# Patient Record
Sex: Male | Born: 1990 | Race: White | Hispanic: No | Marital: Single | Smoking: Current some day smoker
Health system: Southern US, Community
[De-identification: ages and names within clinical notes are randomized; demographics above are authoritative.]

## PROBLEM LIST (undated history)

## (undated) DIAGNOSIS — C801 Malignant (primary) neoplasm, unspecified: Secondary | ICD-10-CM

## (undated) DIAGNOSIS — T7840XA Allergy, unspecified, initial encounter: Secondary | ICD-10-CM

## (undated) DIAGNOSIS — D169 Benign neoplasm of bone and articular cartilage, unspecified: Secondary | ICD-10-CM

## (undated) HISTORY — DX: Benign neoplasm of bone and articular cartilage, unspecified: D16.9

## (undated) HISTORY — PX: TUMOR EXCISION: SHX421

## (undated) HISTORY — DX: Allergy, unspecified, initial encounter: T78.40XA

## (undated) HISTORY — DX: Malignant (primary) neoplasm, unspecified: C80.1

## (undated) HISTORY — PX: OSTEOCHONDROMA EXCISION: SHX2137

---
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
## 2011-05-07 NOTE — Op Note (Signed)
NAME:  Timothy Davila, Timothy Davila NO.:  [id number]   MEDICAL RECORD NO.:  [id number]                   PATIENT TYPE:  OIB   LOCATION:  NA                                   FACILITY:  [facility]   PHYSICIAN:  Dionne Ano. Everlene Other, M.D.         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   PROCEDURE:  Excision, osteochondroma about the right posterior knee region.   SURGEON:  Dionne Ano. Amanda Pea, M.D.   ASSISTANT:  Karie Chimera, P.A.-C.   COMPLICATIONS:  None.   SPECIMENS:  One.   DRAINS:  One.   ESTIMATED BLOOD LOSS:  Less than 35 mL.   INDICATION FOR PROCEDURE:  This patient is a very pleasant 20 year old male.  I have counseled him and his family at length in regard to risks and  benefits of surgery.  The patient has had a prior osteochondroma removed  from his right thumb by myself, and he has done wonderfully from this  without recurrence of his impingement process.  He has had prior  osteochondromas removed from the right leg in the past with recurrence.  I  have discussed with the family that typically these will recur if taken off  before skeletal maturity.  He and the family understand this; however, he is  having significant impingement symptoms about the posterior right lower  extremity with flexion.  Thus, we have made plans to proceed with excision  to allow for easier activities of daily living and improvement with sports  participation.  I have discussed with him risks and benefits at length,  including possible malignant transformation, etc.  With this in  mind, he  desires to proceed.  All questions have been encouraged and answered  preoperatively.   I have discussed with them specifically risks of bleeding, infection, damage  to normal structures, and failure of surgery to accomplish its intended  goals of relieving symptoms and restoring function.   OPERATIVE PROCEDURE:  This patient was taken to the operative suite,  preoperative Ancef was given.  He was laid supine and underwent a general  endotracheal anesthetic under the direction of Sheldon Silvan, M.D.  He was  then turned into the prone position, well-padded.  Rolls were placed under  the chest for support and following this, I examined the right lower  extremity.  Once this was done we placed a tourniquet and performed a  thorough prep and drape with Hibiclens.  He has a SHELLFISH allergy, I  should note.  Once this was done, the sterile field was isolated and a  longitudinal incision  was made under 300 mm of tourniquet control.  The  longitudinal incision was carried through the skin but not __________ along  the previous incision.  Once this was done, the patient had dissection  carried down until the biceps muscle belly was identified.  The long and  short heads were identified and the mass was fairly prominent within the  biceps, and thus at this point in time a muscle-splitting approach was used.  I very carefully retracted the elements toward the midline, which were  protected by the muscle fibers.  The neurovascular structures were kept out  of harm's way with retractors.  The patient's pulse was checked  preoperatively and kept at 90 at all times.  Once the mass was  circumferentially dissected deeply, I then took an osteotome curved in  nature and removed the prominent osteochondroma.  I should note the patient  does have a large sessile osteochondroma near the growth plate, which we did  not plan to take off.  The impinging process, of course, was the one that  was  removed.  This was removed to my satisfaction without difficulty and  sent for specimen.  Following its removal I then curetted the stalk area  down to the normal-appearing bone and then irrigated and placed bone wax  about the area.  The tourniquet was then deflated and hemostasis was  obtained.  Hemostasis was good.  I irrigated the wound copiously and  following this I placed a JP drain.  This was done without difficulty to  allow for egress of any fluid.  Once this was done, the muscle belly was  allowed to lay overlying the area of excision.  The area of excision was  smoothed with rongeur and file prior to closure.  Once the wound was  irrigated, hemostasis was satisfactory and I was pleased.  I then placed  Marcaine 0.5% with epinephrine in the wound for postop analgesia and then  closed the wound with 2-0 Vicryl and 3-0 Prolene.  The drain was hooked up  to suction, a sterile dressing applied, and the patient was then turned  supine, a knee immobilizer placed, and he was then extubated and taken to  the recovery room.  All sponge, needle, and instrument counts were reported  as correct.  There were no immediate intraoperative complications.   The patient will be monitored in the recovery room.  He will spend the night  for IV antibiotics and pain medicine, and we will proceed accordingly.  At  the conclusion of the procedure he had good stability about the femur, of  course, and there were no immediate complications.  Pulses were excellent  and compartments were soft, with a normal popliteal artery pulse.  It has  been a pleasure to participate in his care, and we look forward to  participating in his postoperative recovery.                                                Dionne Ano. Everlene Other, M.D.    Nash Mantis  D:  06/25/2003  T:  06/25/2003  Job:  [id number]

## 2011-09-13 LAB — COMPREHENSIVE METABOLIC PANEL
Albumin: 4
BUN: 9
Creatinine, Ser: 0.88
Glucose, Bld: 102 — ABNORMAL HIGH
Total Bilirubin: 0.9
Total Protein: 6.7

## 2011-09-13 LAB — URINALYSIS, ROUTINE W REFLEX MICROSCOPIC
Bilirubin Urine: NEGATIVE
Glucose, UA: NEGATIVE
Hgb urine dipstick: NEGATIVE
Ketones, ur: NEGATIVE
Protein, ur: NEGATIVE
pH: 6.5

## 2011-09-13 LAB — DIFFERENTIAL
Basophils Absolute: 0
Basophils Relative: 1
Lymphocytes Relative: 36
Monocytes Absolute: 0.5
Neutro Abs: 3.9
Neutrophils Relative %: 53

## 2011-09-13 LAB — CBC
HCT: 42.5
Hemoglobin: 14.7
MCV: 79.1
Platelets: 160
RDW: 13.5

## 2014-02-01 ENCOUNTER — Emergency Department (HOSPITAL_COMMUNITY)
Admission: EM | Admit: 2014-02-01 | Discharge: 2014-02-01 | Disposition: A | Discharge status: Home / Self Care | Payer: Commercial Managed Care - PPO | Attending: Emergency Medicine | Admitting: Emergency Medicine

## 2014-02-01 ENCOUNTER — Encounter (HOSPITAL_COMMUNITY): Payer: Self-pay | Admitting: Emergency Medicine

## 2014-02-01 ENCOUNTER — Emergency Department (HOSPITAL_COMMUNITY): Payer: Commercial Managed Care - PPO

## 2014-02-01 DIAGNOSIS — R112 Nausea with vomiting, unspecified: Secondary | ICD-10-CM | POA: Insufficient documentation

## 2014-02-01 DIAGNOSIS — J029 Acute pharyngitis, unspecified: Secondary | ICD-10-CM | POA: Insufficient documentation

## 2014-02-01 DIAGNOSIS — J111 Influenza due to unidentified influenza virus with other respiratory manifestations: Secondary | ICD-10-CM | POA: Insufficient documentation

## 2014-02-01 DIAGNOSIS — F17200 Nicotine dependence, unspecified, uncomplicated: Secondary | ICD-10-CM | POA: Insufficient documentation

## 2014-02-01 DIAGNOSIS — R69 Illness, unspecified: Secondary | ICD-10-CM

## 2014-02-01 MED ORDER — BENZONATATE 100 MG PO CAPS: 100.0000 mg | ORAL_CAPSULE | Freq: Three times a day (TID) | ORAL | Status: DC

## 2014-02-01 NOTE — ED Notes (Signed)
Pt alert and oriented x4. Respirations even and unlabored, bilateral symmetrical rise and fall of chest. Skin warm and dry. In no acute distress. Denies needs.   

## 2014-02-01 NOTE — Discharge Instructions (Signed)
1. Medications: tessalon, usual home medications 2. Treatment: rest, drink plenty of fluids, tylenol/ibuprofen for fever and body aches,  3. Follow Up: Please followup with your primary doctor for discussion of your diagnoses and further evaluation after today's visit; if you do not have a primary care doctor use the resource guide provided to find one;    Influenza, Adult Influenza ("the flu") is a viral infection of the respiratory tract. It occurs more often in winter months because people spend more time in close contact with one another. Influenza can make you feel very sick. Influenza easily spreads from person to person (contagious). CAUSES  Influenza is caused by a virus that infects the respiratory tract. You can catch the virus by breathing in droplets from an infected person's cough or sneeze. You can also catch the virus by touching something that was recently contaminated with the virus and then touching your mouth, nose, or eyes. SYMPTOMS  Symptoms typically last 4 to 10 days and may include:  Fever.  Chills.  Headache, body aches, and muscle aches.  Sore throat.  Chest discomfort and cough.  Poor appetite.  Weakness or feeling tired.  Dizziness.  Nausea or vomiting. DIAGNOSIS  Diagnosis of influenza is often made based on your history and a physical exam. A nose or throat swab test can be done to confirm the diagnosis. RISKS AND COMPLICATIONS You may be at risk for a more severe case of influenza if you smoke cigarettes, have diabetes, have chronic heart disease (such as heart failure) or lung disease (such as asthma), or if you have a weakened immune system. Elderly people and pregnant women are also at risk for more serious infections. The most common complication of influenza is a lung infection (pneumonia). Sometimes, this complication can require emergency medical care and may be life-threatening. PREVENTION  An annual influenza vaccination (flu shot) is the best  way to avoid getting influenza. An annual flu shot is now routinely recommended for all adults in the [place] TREATMENT  In mild cases, influenza goes away on its own. Treatment is directed at relieving symptoms. For more severe cases, your caregiver may prescribe antiviral medicines to shorten the sickness. Antibiotic medicines are not effective, because the infection is caused by a virus, not by bacteria. HOME CARE INSTRUCTIONS  Only take over-the-counter or prescription medicines for pain, discomfort, or fever as directed by your caregiver.  Use a cool mist humidifier to make breathing easier.  Get plenty of rest until your temperature returns to normal. This usually takes 3 to 4 days.  Drink enough fluids to keep your urine clear or pale yellow.  Cover your mouth and nose when coughing or sneezing, and wash your hands well to avoid spreading the virus.  Stay home from work or school until your fever has been gone for at least 1 full day. SEEK MEDICAL CARE IF:   You have chest pain or a deep cough that worsens or produces more mucus.  You have nausea, vomiting, or diarrhea. SEEK IMMEDIATE MEDICAL CARE IF:   You have difficulty breathing, shortness of breath, or your skin or nails turn bluish.  You have severe neck pain or stiffness.  You have a severe headache, facial pain, or earache.  You have a worsening or recurring fever.  You have nausea or vomiting that cannot be controlled. MAKE SURE YOU:  Understand these instructions.  Will watch your condition.  Will get help right away if you are not doing well or get worse.  Document Released: 12/03/2000 Document Revised: 06/06/2012 Document Reviewed: 03/06/2012 [facility] Patient Information 2014 Timothy Davila, [place].    Emergency Department Resource Guide 1) Find a Doctor and Pay Out of Pocket Although you won't have to find out who is covered by your insurance plan, it is a good idea to ask around and get recommendations. You  will then need to call the office and see if the doctor you have chosen will accept you as a new patient and what types of options they offer for patients who are self-pay. Some doctors offer discounts or will set up payment plans for their patients who do not have insurance, but you will need to ask so you aren't surprised when you get to your appointment.  2) Contact Your Local Health Department Not all health departments have doctors that can see patients for sick visits, but many do, so it is worth a call to see if yours does. If you don't know where your local health department is, you can check in your phone book. The CDC also has a tool to help you locate your state's health department, and many state websites also have listings of all of their local health departments.  3) Find a [facility] If your illness is not likely to be very severe or complicated, you may want to try a walk in clinic. These are popping up all over the country in pharmacies, drugstores, and shopping centers. They're usually staffed by nurse practitioners or physician assistants that have been trained to treat common illnesses and complaints. They're usually fairly quick and inexpensive. However, if you have serious medical issues or chronic medical problems, these are probably not your best option.  No Primary Care Doctor: - Call Health Connect at  [phone] - they can help you locate a primary care doctor that  accepts your insurance, provides certain services, etc. - Physician Referral Service- [phone]  Chronic Pain Problems: Organization         Address  Phone   Notes  [facility]  [phone] Patients need to be referred by their primary care doctor.   Medication Assistance: Organization         Address  Phone   Notes  [facility] Medication [facility] [address] [place], [place] [address] [phone] --Must be a resident of [facility] -- Must  have NO insurance coverage whatsoever (no Medicaid/ Medicare, etc.) -- The pt. MUST have a primary care doctor that directs their care regularly and follows them in the community   [facility]ist  [phone]   [employer]  [phone]    Agencies that provide inexpensive medical care: Organization         Address  Phone   Notes  [place]  [phone]   [facility]    [phone]   [facility] [place], [place] [address] [phone]   [place] [address], [place] [phone]   Planned Parenthood    [phone]   [facility]    [phone]   [place] and [place] [address], [place] Phone:  [phone], Fax:  [phone] Hours of Operation:  9 am - 6 pm, M-F.  Also accepts Medicaid/Medicare and self-pay.  [facility] for [place], [address], [place] Phone: [phone], Fax: [phone]. Hours of  Operation:  8:30 am - 5:30 pm, M-F.  Also accepts Medicaid and self-pay.  [facility] [place] [address], [place] Phone: [phone]   [place], [place], [place] [phone], Ext. [phone] Mondays & Thursdays: 7-9 AM.  First 15 patients are seen on a first come, first serve basis.    [place] Providers:  Organization         Address  Phone   Notes  [facility] [address], [place] [phone] Also accepts self-pay patients.  [facility] [address], [place]  [phone]   [place], [address], [place] [phone]   [facility] [address] [place] [phone]   Lucianne Lei [address], [place]   [phone] Only accepts [place] Access [place] patients after  they have their name applied to their card.   Self-Pay (no insurance) in [facility]:  Organization         Address  Phone   Notes  Sickle Cell Patients, [facility] [place] [phone]   [facility] [place] [phone]   [facility] [place]  [place], [place], [place] [phone]   [facility]/Dr. Osei-Bonsu  [address], [place] or [address], [place] [phone] Phone number for both [place] and [place] locations is the same.  [facility] [address] [place] [phone]   [facility] [address], [place] or [address] [phone]   [facility] [address], [place] [phone], phone; [phone], fax Sees patients 1st and 3rd Saturday of every month.  Must not qualify for public or private insurance (i.e. Medicaid, Medicare, [place] Health Choice, Veterans' Benefits)  Household income should be no more than 200% of the poverty level The clinic cannot treat you if you are pregnant or think you are pregnant  Sexually transmitted diseases are not treated at the clinic.    Dental Care: Organization         Address  Phone  Notes  [facility] of [facility] [place] [phone] Accepts children up to age 67 who are enrolled in [place] or [place]; pregnant women with a Medicaid card; and children who have applied for Medicaid or [place] Health Choice, but were declined, whose parents can pay a reduced fee at time of service.  [facility] Department of [facility]  [address], [place] [phone] Accepts children up to age 46 who are enrolled in [place] or [place]; pregnant women with a Medicaid card; and children who  have applied for Medicaid or [place] Health Choice, but were declined, whose parents can pay a reduced fee at time of service.  [place] Adult Dental Access PROGRAM  [place] [phone] Patients are seen by appointment only. Walk-ins are not accepted. [place] will see patients 52 years of age and older. Monday - Tuesday (8am-5pm) Most Wednesdays (8:30-5pm) $30 per visit, cash only  [facility] Adult Dental Access PROGRAM  [address], [facility] [phone] Patients are seen  by appointment only. Walk-ins are not accepted. [facility] will see patients 62 years of age and older. One Wednesday Evening (Monthly: Volunteer Based).  $30 per visit, cash only  [employer]  [phone] for adults; Children under age 81, call Graduate Pediatric Dentistry at [phone]. Children aged 33-14, please call [phone] to request a pediatric application.  Dental services are provided in all areas of dental care including fillings, crowns and bridges, complete and partial dentures, implants, gum treatment, root canals, and extractions. Preventive care is also provided. Treatment is provided to both adults and children. Patients are selected via a lottery and there is often a waiting list.   [facility] [address], [place]  [phone] [email or web address]   [facility] [address] [place], [place] [phone], Ext. [phone] Second and Fourth Thursday of each month, opens at 6:30 AM; Clinic ends at 9 AM.  Patients are seen on a first-come first-served basis, and a limited number are seen during each clinic.   [facility]  [address] [place], [place] [phone]   Eligibility Requirements You must have lived in [place], [place], or [place] counties for at least the last three months.   You cannot be eligible for state or federal sponsored [place], including [place], [place], or [employer].   You generally cannot be eligible for healthcare insurance through your employer.    How to apply: Eligibility screenings are held every Tuesday and Wednesday afternoon from 1:00 pm until 4:00 pm. You do not need an appointment for the interview!  [facility] [address] [place], [place] [phone]   [facility] Health Department  [phone]   [facility] Health Department  [phone]   [facility] Health Department  [phone]    Behavioral Health Resources in the Community: Intensive Outpatient Programs Organization         Address  Phone  Notes  [facility] Services [address] [place], [place] [phone]   [facility] Outpatient [address], [place], [place] [phone]   [facility] [address] [place], [place]  [phone]   [facility] Mental Health [address]  [place], [place] [phone] or [phone]   Substance Abuse Resources Organization         Address  Phone  Notes  [facility] Drug Services  [phone]   [facility]  [phone]   [facility] [place]  [phone]   Floydene Flock  [phone]   Residential & Outpatient Substance Abuse Program  [phone]   Psychological Services Organization         Address  Phone  Notes  [facility] Behavioral Health  [phone]   [facility]  [phone]   [facility] Mental Health [address], [place] [phone] or [phone]    Mobile Crisis Teams Organization         Address  Phone  Notes  [facility], Mobile Crisis Care Unit  [phone]   [facility]  [address] [place], [place] [phone]   Doristine Locks [address] [place] [phone]    Self-Help/Support Groups Organization         Address  Phone             Notes  [facility]oc[facility] of [place] -  variety of support groups  336- I7437963 Call for more  information  Narcotics Anonymous (NA), [facility] [address], [employer] [place]  2 meetings at this location   Residential Sports administrator         Address  Phone  Notes  [facility] [address],    [place]  [phone]   [facility]  [address] [place] [address], [place], [place] [phone]   [facility] [address] [place], [place] [phone] Admissions: 8am-3pm M-F  [facility] [address]    [place], [place] [phone]   [facility] [address] [place], [place], [place] [phone]   [facility] [address],  [place], [place] [phone]   [facility] - Intensive Outpatient [address], [place], [place] [phone]   [facility] (Addiction Recovery [facility] [address] [place].,  [place], [place] [phone] or [phone]   [facility] ([facility]) [address], [place], [place] [phone] Accepts Medicaid  [facility] [place] [address]  [place] [phone] Substance Abuse/Addiction Treatment   [facility] Organization         Address  Phone  Notes  [facility]  [phone]   Angie Fava, PhD [address] [place], [place]   [phone] or [phone]   [facility] Behavioral   [address] [place], [place] [phone]   [facility] [address] [place], [place] [phone] Insurance/Medicaid/sponsorship through [employer] and Families [address]                                    [place], [place] [phone] Therapy/tele-psych/case  [facility] [address], [place] [phone]    Dr. Lolly Mustache  [phone]   [facility] [place]  [employer] [facility] Dept. 1) [address], [place] 2) [address], [place] 3)  [address], [place] [phone]  [phone]   [facility] Child Abuse Hotline (575)[phone] or [phone] (After Hours)

## 2014-02-01 NOTE — ED Provider Notes (Signed)
Medical screening examination/treatment/procedure(s) were performed by non-physician practitioner and as supervising physician I was immediately available for consultation/collaboration.  EKG Interpretation   None         Ephraim Hamburger, MD 02/01/14 2355

## 2014-02-01 NOTE — ED Notes (Signed)
Per pt, has been sick with fever, congestion, and cough x  5 days.  [facility] gave tamiflu and pt has taken since Monday with no improvement.

## 2014-02-01 NOTE — ED Provider Notes (Signed)
CSN: [id number]     Arrival date & time 02/01/14  1540 History   First MD Initiated Contact with Patient 02/01/14 1855     Chief Complaint  Patient presents with  . Fever  . Cough     (Consider location/radiation/quality/duration/timing/severity/associated sxs/prior Treatment) The history is provided by the patient and medical records. No language interpreter was used.    Timothy Davila is a 23 y.o. male  with a hx of Osteochondroma presents to the Emergency Department complaining of gradual, persistent, progressively worsening URI symptoms including low-grade fever, nasal congestion and cough onset 5 days ago.  [facility] and given tamiflu without relief of symptoms.  Associated symptoms include nasal congestion, rhinorrhea, postnasal drip, sore throat.  He also endorses one episode of emesis this morning without abdominal pain or diarrhea.  Patient reports that cold or flu medications helped the symptoms for only a little while and nothing seems to make her worse. Patient denies headache and neck pain, neck stiffness, abdominal pain, diarrhea, weakness, dizziness, syncope, dysuria. Patient reports he did not get a flu shot this year. He has unknown sick contacts.   History reviewed. No pertinent past medical history. Past Surgical History  Procedure Laterality Date  . Osteochondroma excision    . Tumor excision     History reviewed. No pertinent family history. History  Substance Use Topics  . Smoking status: Current Some Day Smoker  . Smokeless tobacco: Not on file  . Alcohol Use: Yes    Review of Systems  Constitutional: Positive for fever (low grade) and fatigue. Negative for chills and appetite change.  HENT: Positive for congestion, postnasal drip, rhinorrhea, sinus pressure and sore throat. Negative for ear discharge, ear pain and mouth sores.   Eyes: Negative for visual disturbance.  Respiratory: Positive for cough, chest  tightness, shortness of breath and wheezing. Negative for stridor.   Cardiovascular: Negative for chest pain, palpitations and leg swelling.  Gastrointestinal: Positive for nausea (in the mornings) and vomiting (x1 this AM). Negative for abdominal pain and diarrhea.  Genitourinary: Negative for dysuria, urgency, frequency and hematuria.  Musculoskeletal: Negative for arthralgias, back pain, myalgias and neck stiffness.  Skin: Negative for rash.  Neurological: Negative for syncope, light-headedness, numbness and headaches.  Hematological: Negative for adenopathy.  Psychiatric/Behavioral: The patient is not nervous/anxious.   All other systems reviewed and are negative.      Allergies  Shrimp  Home Medications   Current Outpatient Rx  Name  Route  Sig  Dispense  Refill  . benzonatate (TESSALON) 100 MG capsule   Oral   Take 1-2 capsules (100-200 mg total) by mouth every 8 (eight) hours.   21 capsule   0    BP 124/84  Pulse 78  Temp(Src) 98.7 F (37.1 C) (Oral)  Resp 16  SpO2 100% Physical Exam  Nursing note and vitals reviewed. Constitutional: He is oriented to person, place, and time. He appears well-developed and well-nourished. No distress.  Awake, alert, nontoxic appearance  HENT:  Head: Normocephalic and atraumatic.  Right Ear: Tympanic membrane, external ear and ear canal normal.  Left Ear: Tympanic membrane, external ear and ear canal normal.  Nose: Mucosal edema and rhinorrhea present. No epistaxis. Right sinus exhibits no maxillary sinus tenderness and no frontal sinus tenderness. Left sinus exhibits no maxillary sinus tenderness and no frontal sinus tenderness.  Mouth/Throat: Uvula is midline, oropharynx is clear and moist and mucous membranes are normal.  Mucous membranes are not pale and not cyanotic. No oropharyngeal exudate, posterior oropharyngeal edema, posterior oropharyngeal erythema or tonsillar abscesses.  Eyes: Conjunctivae are normal. Pupils are equal,  round, and reactive to light. No scleral icterus.  Neck: Normal range of motion and full passive range of motion without pain. Neck supple.  Cardiovascular: Normal rate, regular rhythm, normal heart sounds and intact distal pulses.   No murmur heard. Pulmonary/Chest: Effort normal and breath sounds normal. No stridor. No respiratory distress. He has no wheezes.  Congested cough but clear and equal breath sounds  Abdominal: Soft. Bowel sounds are normal. He exhibits no mass. There is no tenderness. There is no rebound and no guarding.  Musculoskeletal: Normal range of motion. He exhibits no edema.  Lymphadenopathy:    He has no cervical adenopathy.  Neurological: He is alert and oriented to person, place, and time.  Speech is clear and goal oriented Moves extremities without ataxia  Skin: Skin is warm and dry. No rash noted. He is not diaphoretic.  Psychiatric: He has a normal mood and affect.    ED Course  Procedures (including critical care time) Labs Review Labs Reviewed - No data to display Imaging Review Dg Chest 2 View  02/01/2014   CLINICAL DATA:  Shortness of breath, chest pain, cough.  EXAM: CHEST  2 VIEW  COMPARISON:  None.  FINDINGS: The heart size and mediastinal contours are within normal limits. Both lungs are clear. The visualized skeletal structures are unremarkable.  IMPRESSION: No active cardiopulmonary disease.   Electronically Signed   By: Rolm Baptise M.D.   On: 02/01/2014 16:52    EKG Interpretation   None       MDM   Final diagnoses:  Influenza-like illness   Timothy Davila presents with influenza like illness.  Patient with symptoms consistent with influenza.  Vitals are stable, low-grade fever.  No signs of dehydration, tolerating PO's.  Lungs are clear. CXR without evidence of pneumonia, pulmonary edema or pneumothorax.  Pt has already completed a course of Tamiflu.  Patient will be discharged with instructions to orally hydrate, rest, and use  over-the-counter medications such as anti-inflammatories ibuprofen and Aleve for muscle aches and Tylenol for fever.  Patient will also be given a cough suppressant.   It has been determined that no acute conditions requiring further emergency intervention are present at this time. The patient/guardian have been advised of the diagnosis and plan. We have discussed signs and symptoms that warrant return to the ED, such as changes or worsening in symptoms.   Vital signs are stable at discharge.   BP 124/84  Pulse 78  Temp(Src) 98.7 F (37.1 C) (Oral)  Resp 16  SpO2 100%  Patient/guardian has voiced understanding and agreed to follow-up with the PCP or specialist.       Abigail Butts, PA-C 02/01/14 1925

## 2014-03-19 ENCOUNTER — Ambulatory Visit: Payer: Commercial Managed Care - PPO

## 2014-03-19 ENCOUNTER — Ambulatory Visit (INDEPENDENT_AMBULATORY_CARE_PROVIDER_SITE_OTHER): Payer: Commercial Managed Care - PPO | Admitting: Emergency Medicine

## 2014-03-19 VITALS — BP 124/74 | HR 97 | Temp 98.2°F | Resp 17 | Ht 67.0 in | Wt 147.0 lb

## 2014-03-19 DIAGNOSIS — S139XXA Sprain of joints and ligaments of unspecified parts of neck, initial encounter: Secondary | ICD-10-CM

## 2014-03-19 DIAGNOSIS — S335XXA Sprain of ligaments of lumbar spine, initial encounter: Secondary | ICD-10-CM

## 2014-03-19 DIAGNOSIS — D169 Benign neoplasm of bone and articular cartilage, unspecified: Secondary | ICD-10-CM

## 2014-03-19 DIAGNOSIS — M25579 Pain in unspecified ankle and joints of unspecified foot: Secondary | ICD-10-CM

## 2014-03-19 MED ORDER — NAPROXEN SODIUM 550 MG PO TABS: 550.0000 mg | ORAL_TABLET | Freq: Two times a day (BID) | ORAL | Status: AC

## 2014-03-19 NOTE — Patient Instructions (Signed)
Cervical Sprain A cervical sprain is an injury in the neck in which the strong, fibrous tissues (ligaments) that connect your neck bones stretch or tear. Cervical sprains can range from mild to severe. Severe cervical sprains can cause the neck vertebrae to be unstable. This can lead to damage of the spinal cord and can result in serious nervous system problems. The amount of time it takes for a cervical sprain to get better depends on the cause and extent of the injury. Most cervical sprains heal in 1 to 3 weeks. CAUSES  Severe cervical sprains may be caused by:   Contact sport injuries (such as from football, rugby, wrestling, hockey, auto racing, gymnastics, diving, martial arts, or boxing).   Motor vehicle collisions.   Whiplash injuries. This is an injury from a sudden forward-and backward whipping movement of the head and neck.  Falls.  Mild cervical sprains may be caused by:   Being in an awkward position, such as while cradling a telephone between your ear and shoulder.   Sitting in a chair that does not offer proper support.   Working at a poorly designed computer station.   Looking up or down for long periods of time.  SYMPTOMS   Pain, soreness, stiffness, or a burning sensation in the front, back, or sides of the neck. This discomfort may develop immediately after the injury or slowly, 24 hours or more after the injury.   Pain or tenderness directly in the middle of the back of the neck.   Shoulder or upper back pain.   Limited ability to move the neck.   Headache.   Dizziness.   Weakness, numbness, or tingling in the hands or arms.   Muscle spasms.   Difficulty swallowing or chewing.   Tenderness and swelling of the neck.  DIAGNOSIS  Most of the time your health care provider can diagnose a cervical sprain by taking your history and doing a physical exam. Your health care provider will ask about previous neck injuries and any known neck  problems, such as arthritis in the neck. X-rays may be taken to find out if there are any other problems, such as with the bones of the neck. Other tests, such as a CT scan or MRI, may also be needed.  TREATMENT  Treatment depends on the severity of the cervical sprain. Mild sprains can be treated with rest, keeping the neck in place (immobilization), and pain medicines. Severe cervical sprains are immediately immobilized. Further treatment is done to help with pain, muscle spasms, and other symptoms and may include:  Medicines, such as pain relievers, numbing medicines, or muscle relaxants.   Physical therapy. This may involve stretching exercises, strengthening exercises, and posture training. Exercises and improved posture can help stabilize the neck, strengthen muscles, and help stop symptoms from returning.  HOME CARE INSTRUCTIONS   Put ice on the injured area.   Put ice in a plastic bag.   Place a towel between your skin and the bag.   Leave the ice on for 15 20 minutes, 3 4 times a day.   If your injury was severe, you may have been given a cervical collar to wear. A cervical collar is a two-piece collar designed to keep your neck from moving while it heals.  Do not remove the collar unless instructed by your health care provider.  If you have long hair, keep it outside of the collar.  Ask your health care provider before making any adjustments to your collar.   Minor adjustments may be required over time to improve comfort and reduce pressure on your chin or on the back of your head.  Ifyou are allowed to remove the collar for cleaning or bathing, follow your health care provider's instructions on how to do so safely.  Keep your collar clean by wiping it with mild soap and water and drying it completely. If the collar you have been given includes removable pads, remove them every 1 2 days and hand wash them with soap and water. Allow them to air dry. They should be completely  dry before you wear them in the collar.  If you are allowed to remove the collar for cleaning and bathing, wash and dry the skin of your neck. Check your skin for irritation or sores. If you see any, tell your health care provider.  Do not drive while wearing the collar.   Only take over-the-counter or prescription medicines for pain, discomfort, or fever as directed by your health care provider.   Keep all follow-up appointments as directed by your health care provider.   Keep all physical therapy appointments as directed by your health care provider.   Make any needed adjustments to your workstation to promote good posture.   Avoid positions and activities that make your symptoms worse.   Warm up and stretch before being active to help prevent problems.  SEEK MEDICAL CARE IF:   Your pain is not controlled with medicine.   You are unable to decrease your pain medicine over time as planned.   Your activity level is not improving as expected.  SEEK IMMEDIATE MEDICAL CARE IF:   You develop any bleeding.  You develop stomach upset.  You have signs of an allergic reaction to your medicine.   Your symptoms get worse.   You develop new, unexplained symptoms.   You have numbness, tingling, weakness, or paralysis in any part of your body.  MAKE SURE YOU:   Understand these instructions.  Will watch your condition.  Will get help right away if you are not doing well or get worse. Document Released: 10/03/2007 Document Revised: 09/26/2013 Document Reviewed: 06/13/2013 [employer] Patient Information 2014 [employer].  
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
## 2015-10-03 IMAGING — CR DG THORACIC SPINE 2V
3 series · 3 of 3 positions shown · non-contrast
Comparison: None.

CLINICAL DATA: Back pain.

EXAM:
THORACIC SPINE - 2 VIEW

[AP]
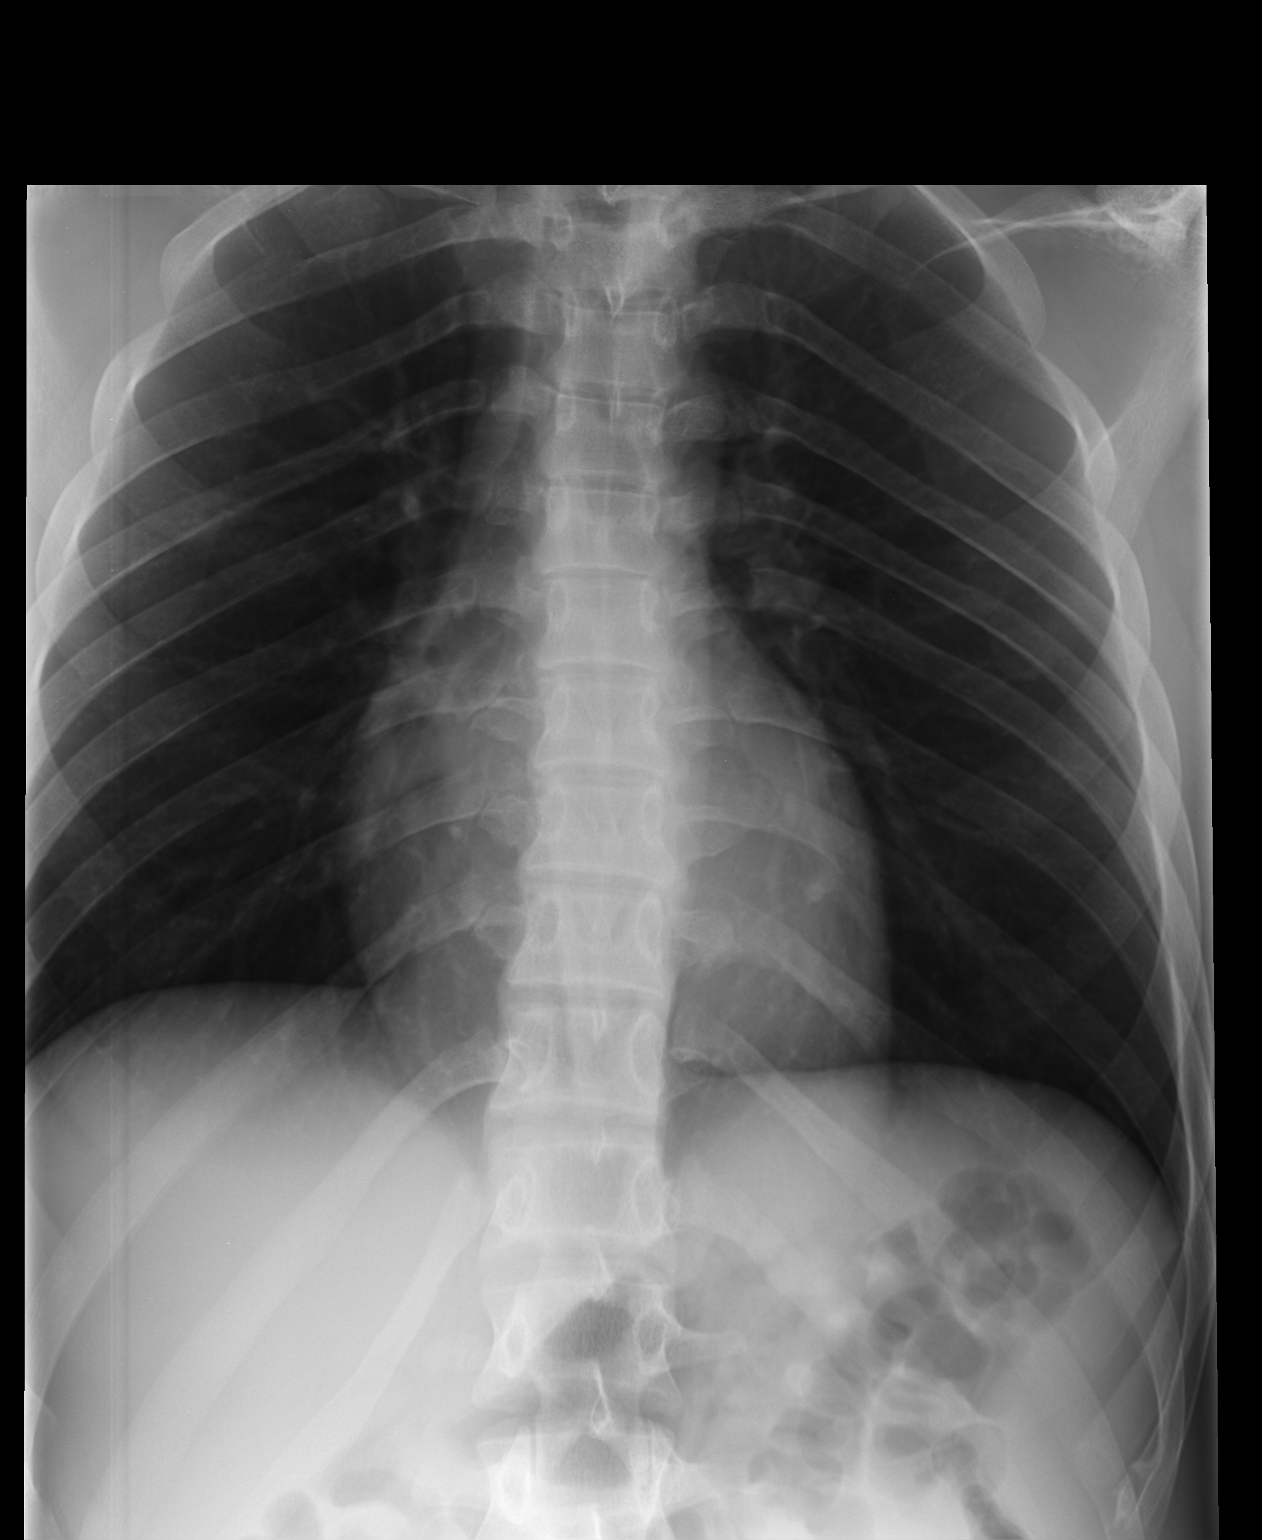

[lateral (1 of 2)]
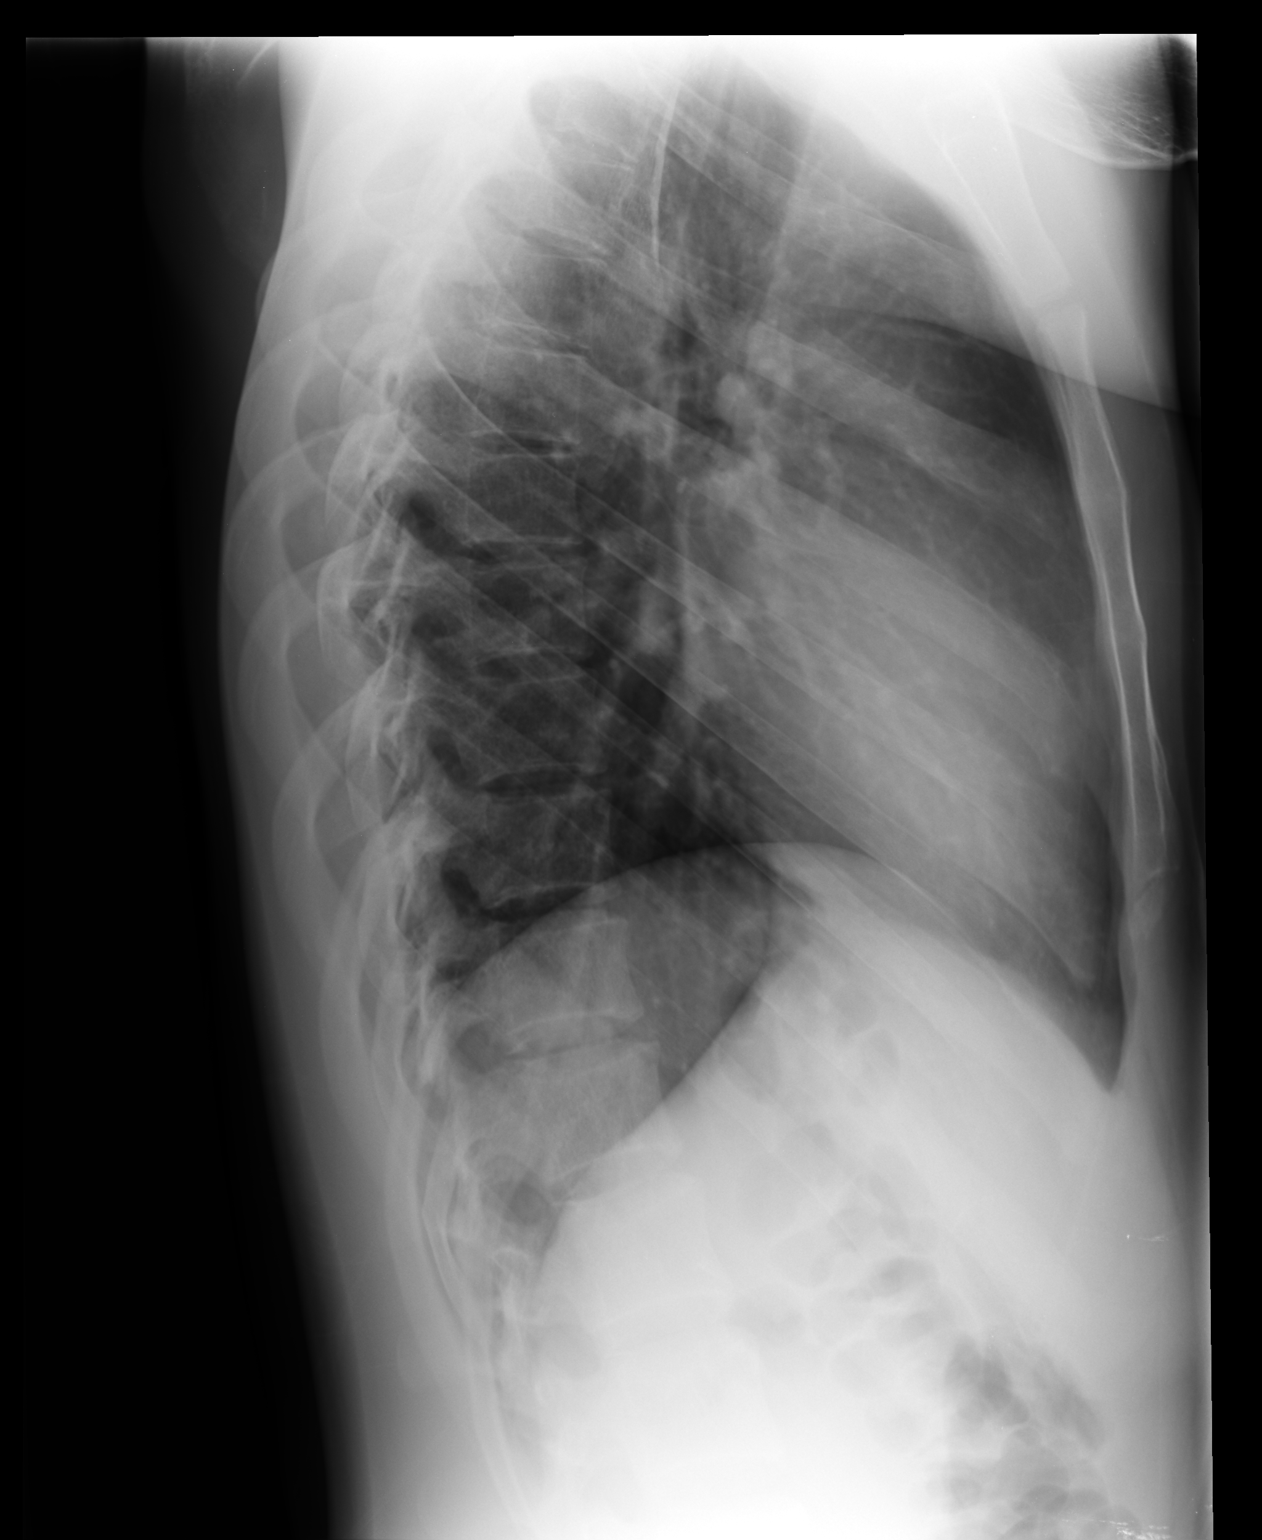

[lateral (2 of 2)]
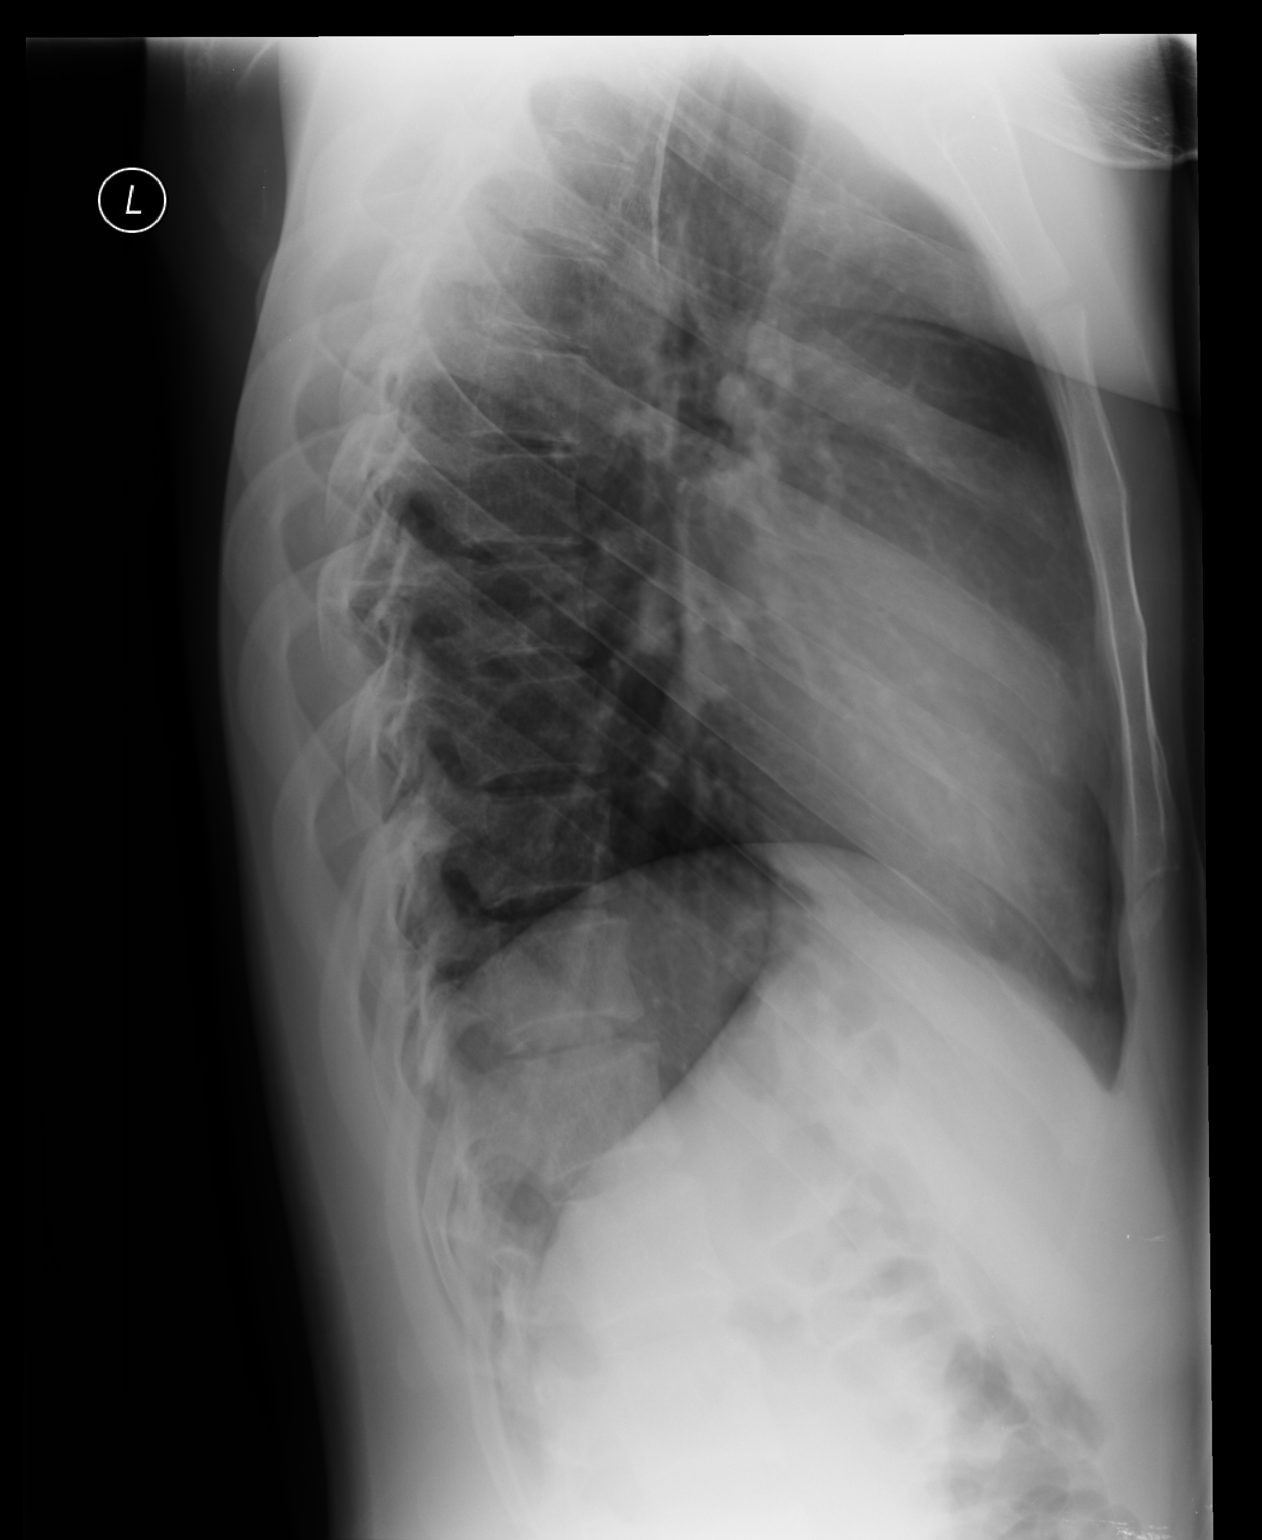

[3 of 3 positions shown; findings below may reference images not displayed]

FINDINGS: There is no evidence of thoracic spine fracture. Alignment is
normal. No other significant bone abnormalities are identified.
IMPRESSION: Negative.

## 2018-08-09 ENCOUNTER — Other Ambulatory Visit: Payer: Self-pay

## 2018-08-09 ENCOUNTER — Encounter: Payer: Self-pay | Admitting: Emergency Medicine

## 2018-08-09 ENCOUNTER — Emergency Department
Admission: EM | Admit: 2018-08-09 | Discharge: 2018-08-09 | Disposition: A | Discharge status: Home / Self Care | Payer: Self-pay | Source: Home / Self Care | Attending: Emergency Medicine | Admitting: Emergency Medicine

## 2018-08-09 DIAGNOSIS — D171 Benign lipomatous neoplasm of skin and subcutaneous tissue of trunk: Secondary | ICD-10-CM

## 2018-08-09 NOTE — ED Triage Notes (Signed)
Patient has had lumps on right side chest near axilla in past; now has one on left side of chest near axilla which is tender to touch.

## 2018-08-09 NOTE — ED Provider Notes (Addendum)
[facility]    CSN: [id number] Arrival date & time: 08/09/18  1237     History   Chief Complaint Chief Complaint  Patient presents with  . Mass    HPI Timothy Davila is a 27 y.o. male.   HPI Patient complains of small skin lumps, on left and right chest, that he first noticed 4 days ago. The one on the left chest is superior- lateral to the left nipple, got as large as 1 cm and was painful, rubbery and some redness without red streaks or warmth or bleeding or drainage.  Over the past 2 days, has decreased in size and is no longer red.  It is less painful to touch.  No associated left axillary mass or symptoms. On the right chest, superior lateral to right nipple, he is noted a slightly sore movable rubbery skin lump.  No drainage or redness or heat or bleeding.  2 days ago, noticed a tiny pea-sized skin lump right axilla, not painful. Denies any recent trauma or shaving of skin and axilla or chest. He otherwise feels well without any systemic symptoms.  Denies exertional chest pain, palpitations, fever, chills, nausea, vomiting, shortness of breath, abdominal pain.  Denies any ENT symptoms.  He mentions that he needs to see a dentist but denies any dental pain or drainage.  Of particular note is his past medical history.  Diagnosis of osteosarcoma right hip 2010, excised and in remission.  He was seeing oncologist very regularly up until 2016, when he was released and lost to follow-up.  He has had benign osteochondromas as a child, one right leg and one left leg. Currently denies any musculoskeletal symptoms.  Denies hip or leg pain, denies arm or shoulder pain or any bony or joint or musculoskeletal symptoms.  Past Medical History:  Diagnosis Date  . Allergy   . Cancer ([place])   . Osteochondroma     Patient Active Problem List   Diagnosis Date Noted  . Osteochondroma 03/19/2014    Past Surgical History:  Procedure Laterality Date  . OSTEOCHONDROMA EXCISION      . TUMOR EXCISION         Home Medications    Prior to Admission medications   Not on File    Family History Family History  Problem Relation Age of Onset  . Cancer Maternal Grandmother   . Stroke Maternal Grandmother   . Cancer Paternal Grandmother   . Diabetes Paternal Grandmother     Social History Social History   Tobacco Use  . Smoking status: Current Some Day Smoker    Packs/day: 0.30    Types: Cigarettes  . Smokeless tobacco: Former Systems developer    Types: Chew  Substance Use Topics  . Alcohol use: Yes  . Drug use: Yes    Types: Marijuana   He works for [employer].  Denies any recent injuries.  Allergies   Shrimp [shellfish allergy]   Review of Systems Review of Systems  All other systems reviewed and are negative.    Physical Exam Triage Vital Signs ED Triage Vitals  Enc Vitals Group     BP 08/09/18 1338 130/68     Pulse Rate 08/09/18 1338 73     Resp 08/09/18 1338 16     Temp 08/09/18 1338 98.3 F (36.8 C)     Temp Source 08/09/18 1338 Oral     SpO2 08/09/18 1338 97 %     Weight 08/09/18 1339 165 lb (74.8 kg)  Height 08/09/18 1339 5\' 7"  (1.702 m)     Head Circumference --      Peak Flow --      Pain Score 08/09/18 1338 1     Pain Loc --      Pain Edu? --      Excl. in [place]? --    No data found.  Updated Vital Signs BP 130/68 (BP Location: Right Arm)   Pulse 73   Temp 98.3 F (36.8 C) (Oral)   Resp 16   Ht 5\' 7"  (1.702 m)   Wt 74.8 kg   SpO2 97%   BMI 25.84 kg/m   Physical Exam  Constitutional: He is oriented to person, place, and time. He appears well-developed and well-nourished. No distress.  HENT:  Oropharynx: No abnormalities, except some dental caries but no sign of dental abscess.  No dental tenderness to percussion.  No drainage. Head: Normocephalic and atraumatic.  Eyes: Pupils are equal, round, and reactive to light. No scleral icterus.  Neck: Normal range of motion. Neck supple. Nontender.  No cervical, submental, or  or periclavicular mass or lymph nodes felt. Chest: Left anterior chest, there is movable, rubbery, 6 x 6 mm subcutaneous cystic mass that is movable, minimally tender, without fluctuance or induration or heat or red streaks or any other skin change.  No mass or adenopathy left axilla.  Left nipple otherwise normal. Right anterior chest, just superior lateral to right nipple is 4 x 4 mm subcutaneous cystic mass that is movable, minimally tender without fluctuance or induration or heat or red streaks or any other skin change.  Right nipple otherwise normal. Right axilla, there is a rubbery, mobile, 3 x 3 mm pea-sized subcutaneous mass without fluctuance or induration.  Minimal tenderness.  No bleeding or drainage.  No definite axillary adenopathy. Cardiovascular: Normal rate and regular rhythm. No murmurs gallops or rubs Pulmonary/Chest: Effort normal. Clear to auscultation Abdominal: He exhibits no distension. Soft and nontender. Neurological: He is alert and oriented to person, place, and time. No cranial nerve deficit.  Skin: Skin is warm and dry. No rash Psychiatric: He has a normal mood and affect. His behavior is normal.  Vitals reviewed.   UC Treatments / Results  Labs (all labs ordered are listed, but only abnormal results are displayed) Labs Reviewed - No data to display  EKG None  Radiology No results found.  Procedures Procedures (including critical care time)  Medications Ordered in UC Medications - No data to display  Initial Impression / Assessment and Plan / UC Course  I have reviewed the triage vital signs and the nursing notes.  Pertinent labs & imaging results that were available during my care of the patient were reviewed by me and considered in my medical decision making (see chart for details).      Final Clinical Impressions(s) / UC Diagnoses  Most likely, has tiny less than 1 cm lipomas or sebaceous cyst, 1 on left chest wall and one right chest wall.  By  his history, these are not increasing and the left one is decreasing in size, making this much less worrisome for malignancy. In my opinion, the pea-sized rubbery lymph node or skin cyst right axilla is likely not significant.-We discussed diagnoses and other possibilities and discussed options at length.  Explained there is no sign of infection and no antibiotic indicated.  I cautioned him to watch these closely and if not resolved in 7 days, he needs to to follow-up here or PCP.  Other red flags discussed.  Gave him names and phone numbers of PCPs in our building.  I explained that if any of the cystic lesions enlarge or do not resolve, then he will need referral to a surgeon for biopsy.  I also counseled him at length to establish with a PCP for ongoing checkups and health maintenance care.  He voiced understanding and agreement.  I offered and advised to print AVS for him, but he declined.  I also advised in our discussion, that he should establish with, and see a dentist for routine dental care.   Final diagnoses:  Lipoma of chest   Discharge Instructions   None    ED Prescriptions    None       Jacqulyn Cane, MD 08/09/18 1427    Jacqulyn Cane, MD 08/09/18 1429

## 2018-08-15 ENCOUNTER — Telehealth: Payer: Self-pay | Admitting: Emergency Medicine

## 2018-08-15 NOTE — Telephone Encounter (Signed)
Patient states lump has decreased in size.
